# Patient Record
Sex: Male | Born: 1995 | Race: White | Hispanic: No | Smoking: Never smoker
Health system: Southern US, Community
[De-identification: ages and names within clinical notes are randomized; demographics above are authoritative.]

---
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
## 2020-01-24 ENCOUNTER — Other Ambulatory Visit: Payer: Self-pay

## 2020-01-24 ENCOUNTER — Emergency Department (HOSPITAL_COMMUNITY)
Admission: EM | Admit: 2020-01-24 | Discharge: 2020-01-24 | Disposition: A | Discharge status: Home / Self Care | Payer: Self-pay | Attending: Emergency Medicine | Admitting: Emergency Medicine

## 2020-01-24 ENCOUNTER — Encounter (HOSPITAL_COMMUNITY): Payer: Self-pay | Admitting: Emergency Medicine

## 2020-01-24 ENCOUNTER — Emergency Department (HOSPITAL_COMMUNITY): Payer: Self-pay

## 2020-01-24 DIAGNOSIS — R112 Nausea with vomiting, unspecified: Secondary | ICD-10-CM | POA: Insufficient documentation

## 2020-01-24 DIAGNOSIS — N200 Calculus of kidney: Secondary | ICD-10-CM | POA: Insufficient documentation

## 2020-01-24 LAB — BASIC METABOLIC PANEL
Anion gap: 11 (ref 5–15)
BUN: 14 mg/dL (ref 6–20)
CO2: 22 mmol/L (ref 22–32)
Calcium: 9.1 mg/dL (ref 8.9–10.3)
Chloride: 97 mmol/L — ABNORMAL LOW (ref 98–111)
Creatinine, Ser: 1.11 mg/dL (ref 0.61–1.24)
GFR calc Af Amer: >60 mL/min (ref >60)
GFR calc non Af Amer: >60 mL/min (ref >60)
Glucose, Bld: 144 mg/dL — ABNORMAL HIGH (ref 70–99)
Potassium: 3.8 mmol/L (ref 3.5–5.1)
Sodium: 130 mmol/L — ABNORMAL LOW (ref 135–145)

## 2020-01-24 LAB — URINALYSIS, ROUTINE W REFLEX MICROSCOPIC
Bilirubin Urine: NEGATIVE
Glucose, UA: NEGATIVE mg/dL
Ketones, ur: NEGATIVE mg/dL
Leukocytes,Ua: NEGATIVE
Nitrite: NEGATIVE
Protein, ur: 30 mg/dL — AB
RBC / HPF: >50 RBC/hpf — ABNORMAL HIGH (ref 0–5)
Specific Gravity, Urine: 1.011 (ref 1.005–1.030)
pH: 7 (ref 5.0–8.0)

## 2020-01-24 LAB — CBC WITH DIFFERENTIAL/PLATELET
Abs Immature Granulocytes: 0.08 10*3/uL — ABNORMAL HIGH (ref 0.00–0.07)
Basophils Absolute: 0 10*3/uL (ref 0.0–0.1)
Basophils Relative: 0 %
Eosinophils Absolute: 0.3 10*3/uL (ref 0.0–0.5)
Eosinophils Relative: 2 %
HCT: 43.4 % (ref 39.0–52.0)
Hemoglobin: 15.2 g/dL (ref 13.0–17.0)
Immature Granulocytes: 1 %
Lymphocytes Relative: 14 %
Lymphs Abs: 2.1 10*3/uL (ref 0.7–4.0)
MCH: 30.7 pg (ref 26.0–34.0)
MCHC: 35 g/dL (ref 30.0–36.0)
MCV: 87.7 fL (ref 80.0–100.0)
Monocytes Absolute: 0.9 10*3/uL (ref 0.1–1.0)
Monocytes Relative: 6 %
Neutro Abs: 11.5 10*3/uL — ABNORMAL HIGH (ref 1.7–7.7)
Neutrophils Relative %: 77 %
Platelets: 248 10*3/uL (ref 150–400)
RBC: 4.95 MIL/uL (ref 4.22–5.81)
RDW: 12.3 % (ref 11.5–15.5)
WBC: 14.9 10*3/uL — ABNORMAL HIGH (ref 4.0–10.5)
nRBC: 0 % (ref 0.0–0.2)

## 2020-01-24 LAB — HEPATIC FUNCTION PANEL
ALT: 22 U/L (ref 0–44)
AST: 19 U/L (ref 15–41)
Albumin: 5 g/dL (ref 3.5–5.0)
Alkaline Phosphatase: 51 U/L (ref 38–126)
Bilirubin, Direct: 0.2 mg/dL (ref 0.0–0.2)
Indirect Bilirubin: 1.3 mg/dL — ABNORMAL HIGH (ref 0.3–0.9)
Total Bilirubin: 1.5 mg/dL — ABNORMAL HIGH (ref 0.3–1.2)
Total Protein: 7.9 g/dL (ref 6.5–8.1)

## 2020-01-24 LAB — LIPASE, BLOOD: Lipase: 19 U/L (ref 11–51)

## 2020-01-24 MED ORDER — KETOROLAC TROMETHAMINE 30 MG/ML IJ SOLN
30.0000 mg | Freq: Once | INTRAMUSCULAR | Status: AC
  Administered 2020-01-24: 30 mg via INTRAMUSCULAR
  Filled 2020-01-24 (×2): qty 1

## 2020-01-24 MED ORDER — KETOROLAC TROMETHAMINE 30 MG/ML IJ SOLN: 30.0000 mg | Freq: Once | INTRAMUSCULAR | Status: DC

## 2020-01-24 MED ORDER — ONDANSETRON 4 MG PO TBDP
4.0000 mg | ORAL_TABLET | Freq: Once | ORAL | Status: AC
  Administered 2020-01-24: 4 mg via ORAL
  Filled 2020-01-24: qty 1

## 2020-01-24 MED ORDER — OXYCODONE-ACETAMINOPHEN 5-325 MG PO TABS: 1.0000 | ORAL_TABLET | Freq: Four times a day (QID) | ORAL | 0 refills | Status: AC | PRN

## 2020-01-24 MED ORDER — OXYCODONE-ACETAMINOPHEN 5-325 MG PO TABS
1.0000 | ORAL_TABLET | Freq: Once | ORAL | Status: AC
  Administered 2020-01-24: 1 via ORAL
  Filled 2020-01-24 (×2): qty 1

## 2020-01-24 MED ORDER — TAMSULOSIN HCL 0.4 MG PO CAPS: 0.4000 mg | ORAL_CAPSULE | Freq: Every day | ORAL | 0 refills | Status: AC

## 2020-01-24 MED ORDER — IBUPROFEN 800 MG PO TABS: 800.0000 mg | ORAL_TABLET | Freq: Three times a day (TID) | ORAL | 0 refills | Status: AC | PRN

## 2020-01-24 NOTE — ED Provider Notes (Signed)
Emergency Department Provider Note   I have reviewed the triage vital signs and the nursing notes.   HISTORY  Chief Complaint Abdominal Pain   HPI Frederick Campbell is a 24 y.o. male with no significant PMH presents to the ED with sudden, sharp RLQ abdominal pain. Pain began suddenly this evening while playing video games. He has associated nausea and vomiting. Pain comes in waves and is moderate with intermittent severe pain. Pain radiates somewhat to the suprapubic region. No testicle pain/swelling. Patient has some urinary hesitancy as well. No gross hematuria. No back or flank pain.   History reviewed. No pertinent past medical history.  There are no problems to display for this patient.   History reviewed. No pertinent surgical history.  Allergies Patient has no known allergies.  No family history on file.  Social History Social History   Tobacco Use  . Smoking status: Never Smoker  . Smokeless tobacco: Never Used  Substance Use Topics  . Alcohol use: Yes  . Drug use: Yes    Types: Marijuana    Review of Systems  Constitutional: No fever/chills Eyes: No visual changes. ENT: No sore throat. Cardiovascular: Denies chest pain. Respiratory: Denies shortness of breath. Gastrointestinal: Positive RLQ abdominal pain. Positive nausea and vomiting.  No diarrhea.  No constipation. Genitourinary: Negative for dysuria. Musculoskeletal: Negative for back pain. Skin: Negative for rash. Neurological: Negative for headaches, focal weakness or numbness.  10-point ROS otherwise negative.  ____________________________________________   PHYSICAL EXAM:  VITAL SIGNS: ED Triage Vitals  Enc Vitals Group     BP 01/24/20 0403 (!) 167/98     Pulse Rate 01/24/20 0403 89     Resp 01/24/20 0403 18     Temp 01/24/20 0403 98 F (36.7 C)     Temp Source 01/24/20 0403 Oral     SpO2 01/24/20 0403 100 %     Weight 01/24/20 0403 245 lb (111.1 kg)     Height 01/24/20 0403 6'  (1.829 m)   Constitutional: Alert and oriented. Well appearing and in no acute distress. Eyes: Conjunctivae are normal. Head: Atraumatic. Nose: No congestion/rhinnorhea. Mouth/Throat: Mucous membranes are moist.   Neck: No stridor.  Cardiovascular: Normal rate, regular rhythm. Good peripheral circulation. Grossly normal heart sounds.   Respiratory: Normal respiratory effort.  No retractions. Lungs CTAB. Gastrointestinal: Soft and nontender. No focal tenderness on exam. No distention.  Musculoskeletal: No gross deformities of extremities. Neurologic:  Normal speech and language.  Skin:  Skin is warm, dry and intact. No rash noted.  ____________________________________________   LABS (all labs ordered are listed, but only abnormal results are displayed)  Labs Reviewed  CBC WITH DIFFERENTIAL/PLATELET - Abnormal; Notable for the following components:      Result Value   WBC 14.9 (*)    Neutro Abs 11.5 (*)    Abs Immature Granulocytes 0.08 (*)    All other components within normal limits  BASIC METABOLIC PANEL - Abnormal; Notable for the following components:   Sodium 130 (*)    Chloride 97 (*)    Glucose, Bld 144 (*)    All other components within normal limits  URINALYSIS, ROUTINE W REFLEX MICROSCOPIC - Abnormal; Notable for the following components:   APPearance HAZY (*)    Hgb urine dipstick LARGE (*)    Protein, ur 30 (*)    RBC / HPF >50 (*)    Bacteria, UA RARE (*)    All other components within normal limits  HEPATIC FUNCTION PANEL -  Abnormal; Notable for the following components:   Total Bilirubin 1.5 (*)    Indirect Bilirubin 1.3 (*)    All other components within normal limits  LIPASE, BLOOD   ____________________________________________  RADIOLOGY  CT Renal Stone Study  Result Date: 01/24/2020 CLINICAL DATA:  Abdominal pain EXAM: CT ABDOMEN AND PELVIS WITHOUT CONTRAST TECHNIQUE: Multidetector CT imaging of the abdomen and pelvis was performed following the  standard protocol without IV contrast. COMPARISON:  None. FINDINGS: Lower chest: No acute abnormality. Hepatobiliary: No focal liver abnormality is seen. No gallstones, gallbladder wall thickening, or biliary dilatation. Pancreas: Unremarkable. No pancreatic ductal dilatation or surrounding inflammatory changes. Spleen: Normal in size without focal abnormality. Adrenals/Urinary Tract: Adrenal glands are within normal limits. Left kidney demonstrates no renal calculi or obstructive changes. Right kidney demonstrates mild fullness of the collecting system and right ureter. This extends inferiorly to the distal right ureter where a small 3 mm stone is identified best seen on image number 79 of series 2. The bladder is decompressed. Stomach/Bowel: Mild diverticular changes noted in the colon without evidence of diverticulitis. The appendix is within normal limits. No inflammatory changes are noted. The small bowel and stomach are unremarkable. Vascular/Lymphatic: No significant vascular findings are present. No enlarged abdominal or pelvic lymph nodes. Reproductive: Prostate is unremarkable. Other: No abdominal wall hernia or abnormality. No abdominopelvic ascites. Musculoskeletal: No acute or significant osseous findings. IMPRESSION: Distal right ureteral stone measuring 3 mm with mild hydronephrosis. Diverticulosis without diverticulitis. Electronically Signed   By: Alcide Clever M.D.   On: 01/24/2020 05:05    ____________________________________________   PROCEDURES  Procedure(s) performed:   Procedures  None  ____________________________________________   INITIAL IMPRESSION / ASSESSMENT AND PLAN / ED COURSE  Pertinent labs & imaging results that were available during my care of the patient were reviewed by me and considered in my medical decision making (see chart for details).   Patient presents to the ED with severe RLQ abdominal pain without tenderness on exam. Intermittent, severe/sharp pain  noted. Ureterolithiasis suspected. Doubt appendicitis without focal tenderness on exam. No testicle pain to suspect torsion. Plan for CT renal, labs, and Toradol for pain. Nausea improved after ODT Zofran.   3 mm ureteral stone on the right with hydro. Patient's pain has completely resolved. Discussed CT findings and Urology follow up plan. Called in Rx for pain and Flomax. Reviewed [place] drug database. Discussed ED return precautions. No UTI on UA.  ____________________________________________  FINAL CLINICAL IMPRESSION(S) / ED DIAGNOSES  Final diagnoses:  Kidney stone     MEDICATIONS GIVEN DURING THIS VISIT:  Medications  oxyCODONE-acetaminophen (PERCOCET/ROXICET) 5-325 MG per tablet 1 tablet (1 tablet Oral Given 01/24/20 0525)  ondansetron (ZOFRAN-ODT) disintegrating tablet 4 mg (4 mg Oral Given 01/24/20 0434)  ketorolac (TORADOL) 30 MG/ML injection 30 mg (30 mg Intramuscular Given 01/24/20 0525)     NEW OUTPATIENT MEDICATIONS STARTED DURING THIS VISIT:  New Prescriptions   IBUPROFEN (ADVIL) 800 MG TABLET    Take 1 tablet (800 mg total) by mouth every 8 (eight) hours as needed for moderate pain.   OXYCODONE-ACETAMINOPHEN (PERCOCET/ROXICET) 5-325 MG TABLET    Take 1 tablet by mouth every 6 (six) hours as needed for severe pain.   TAMSULOSIN (FLOMAX) 0.4 MG CAPS CAPSULE    Take 1 capsule (0.4 mg total) by mouth daily.    Note:  This document was prepared using Dragon voice recognition software and may include unintentional dictation errors.  Alona Bene, MD, [facility]  Maia Plan, MD 01/24/20 [phone]

## 2020-01-24 NOTE — ED Triage Notes (Signed)
Patient states he is having sharp lower abdominal pain, mostly concentrated to the right lower quadrant. Patient states difficulty urinating also.

## 2020-01-24 NOTE — Discharge Instructions (Addendum)
